# Patient Record
Sex: Female | Born: 1965 | Race: White | Hispanic: No | Marital: Married | Smoking: Current every day smoker
Health system: Southern US, Community
[De-identification: ages and names within clinical notes are randomized; demographics above are authoritative.]

## PROBLEM LIST (undated history)

## (undated) DIAGNOSIS — R42 Dizziness and giddiness: Secondary | ICD-10-CM

## (undated) DIAGNOSIS — E119 Type 2 diabetes mellitus without complications: Secondary | ICD-10-CM

## (undated) DIAGNOSIS — I10 Essential (primary) hypertension: Secondary | ICD-10-CM

---

## 2004-01-09 ENCOUNTER — Emergency Department: Payer: Self-pay | Admitting: Emergency Medicine

## 2004-05-06 ENCOUNTER — Emergency Department: Payer: Self-pay | Admitting: Emergency Medicine

## 2004-12-02 ENCOUNTER — Emergency Department: Payer: Self-pay | Admitting: Internal Medicine

## 2004-12-03 ENCOUNTER — Emergency Department: Payer: Self-pay | Admitting: Emergency Medicine

## 2005-02-27 ENCOUNTER — Ambulatory Visit: Payer: Self-pay

## 2005-10-05 ENCOUNTER — Emergency Department: Payer: Self-pay | Admitting: Emergency Medicine

## 2006-05-30 ENCOUNTER — Emergency Department: Payer: Self-pay | Admitting: Emergency Medicine

## 2007-08-01 ENCOUNTER — Emergency Department: Payer: Self-pay | Admitting: Emergency Medicine

## 2007-08-06 ENCOUNTER — Emergency Department: Payer: Self-pay | Admitting: Emergency Medicine

## 2007-08-07 ENCOUNTER — Other Ambulatory Visit: Payer: Self-pay

## 2008-04-22 ENCOUNTER — Emergency Department: Payer: Self-pay | Admitting: Emergency Medicine

## 2008-09-07 ENCOUNTER — Emergency Department: Payer: Self-pay | Admitting: Emergency Medicine

## 2008-12-21 ENCOUNTER — Emergency Department: Payer: Self-pay | Admitting: Emergency Medicine

## 2009-01-13 ENCOUNTER — Emergency Department: Payer: Self-pay | Admitting: Emergency Medicine

## 2009-04-07 ENCOUNTER — Emergency Department: Payer: Self-pay | Admitting: Emergency Medicine

## 2009-09-13 ENCOUNTER — Emergency Department (HOSPITAL_COMMUNITY): Admission: EM | Admit: 2009-09-13 | Discharge: 2009-09-13 | Payer: Self-pay | Admitting: Emergency Medicine

## 2011-10-02 ENCOUNTER — Emergency Department (HOSPITAL_COMMUNITY)
Admission: EM | Admit: 2011-10-02 | Discharge: 2011-10-02 | Disposition: A | Discharge status: Home / Self Care | Payer: Self-pay | Attending: Emergency Medicine | Admitting: Emergency Medicine

## 2011-10-02 ENCOUNTER — Encounter (HOSPITAL_COMMUNITY): Payer: Self-pay | Admitting: Adult Health

## 2011-10-02 DIAGNOSIS — F17200 Nicotine dependence, unspecified, uncomplicated: Secondary | ICD-10-CM | POA: Insufficient documentation

## 2011-10-02 DIAGNOSIS — K047 Periapical abscess without sinus: Secondary | ICD-10-CM | POA: Insufficient documentation

## 2011-10-02 MED ORDER — PENICILLIN V POTASSIUM 250 MG PO TABS
500.0000 mg | ORAL_TABLET | Freq: Once | ORAL | Status: AC
  Administered 2011-10-02: 500 mg via ORAL
  Filled 2011-10-02: qty 2

## 2011-10-02 MED ORDER — PENICILLIN V POTASSIUM 500 MG PO TABS: 500.0000 mg | ORAL_TABLET | Freq: Four times a day (QID) | ORAL | Status: AC

## 2011-10-02 NOTE — ED Provider Notes (Signed)
Medical screening examination/treatment/procedure(s) were performed by non-physician practitioner and as supervising physician I was immediately available for consultation/collaboration. Angelisse Riso, MD, FACEP   Yossi Hinchman L Tatem Fesler, MD 10/02/11 2312 

## 2011-10-02 NOTE — ED Provider Notes (Signed)
History     CSN: [id number]  Arrival date & time 10/02/11  1859   First MD Initiated Contact with Patient 10/02/11 2122      Chief Complaint  Patient presents with  . Dental Pain    (Consider location/radiation/quality/duration/timing/severity/associated sxs/prior treatment) HPI Comments: Patient has dental decay, her right upper second molar is decayed into the gumline.  There is now painful for the past 2, days, over-the-counter ibuprofen, with a moderate amount of relief.  She states she does not want any narcotic pain medicine, as she had a bad reaction in the past, but she is concerned for infection.  Patient is a 46 y.o. female presenting with tooth pain. The history is provided by the patient.  Dental PainThe primary symptoms include mouth pain. Primary symptoms do not include headaches or fever. The symptoms began yesterday. The symptoms are unchanged. The symptoms occur constantly.  Additional symptoms do not include: ear pain.    History reviewed. No pertinent past medical history.  History reviewed. No pertinent past surgical history.  History reviewed. No pertinent family history.  History  Substance Use Topics  . Smoking status: Current Everyday Smoker  . Smokeless tobacco: Not on file  . Alcohol Use: No    OB History    Grav Para Term Preterm Abortions TAB SAB Ect Mult Living                  Review of Systems  Constitutional: Negative for fever and chills.  HENT: Negative for ear pain.   Gastrointestinal: Negative for nausea.  Neurological: Negative for dizziness, weakness and headaches.    Allergies  Codeine  Home Medications   Current Outpatient Rx  Name Route Sig Dispense Refill  . IBUPROFEN 200 MG PO TABS Oral Take 800 mg by mouth every 6 (six) hours as needed. For pain      BP 125/65  Pulse 85  Temp 98.6 F (37 C)  Resp 16  SpO2 97%  Physical Exam  Constitutional: She appears well-developed and well-nourished.  HENT:  Head:  Normocephalic.  Mouth/Throat:    Neck: Normal range of motion.  Cardiovascular: Normal rate.   Musculoskeletal: Normal range of motion.  Neurological: She is alert.    ED Course  Procedures (including critical care time)  Labs Reviewed - No data to display No results found.   1. Dental abscess       MDM   Patient is refusing any narcotic pain medication.  I will start her on penicillin 5 mg 4 times a day for dental abscess.  I will refer her to Dr. Leanord Asal, DDS        Arman Filter, NP 10/02/11 2127  Arman Filter, NP 10/02/11 2127

## 2011-10-02 NOTE — ED Notes (Signed)
C/o right lower tooth pain , dental caries noted. Pain began yesterday nothing makes pain better.

## 2012-04-14 ENCOUNTER — Emergency Department: Payer: Self-pay | Admitting: Emergency Medicine

## 2019-02-05 ENCOUNTER — Other Ambulatory Visit: Payer: Self-pay

## 2019-02-05 ENCOUNTER — Encounter (HOSPITAL_COMMUNITY): Payer: Self-pay

## 2019-02-05 DIAGNOSIS — Z5321 Procedure and treatment not carried out due to patient leaving prior to being seen by health care provider: Secondary | ICD-10-CM | POA: Insufficient documentation

## 2019-02-05 NOTE — ED Triage Notes (Signed)
Pain in right knee intermittently for several days.  Pt denies injury or truama

## 2019-02-06 ENCOUNTER — Emergency Department (HOSPITAL_COMMUNITY)
Admission: EM | Admit: 2019-02-06 | Discharge: 2019-02-06 | Disposition: A | Discharge status: Home / Self Care | Payer: Self-pay | Attending: Emergency Medicine | Admitting: Emergency Medicine

## 2019-02-06 HISTORY — DX: Type 2 diabetes mellitus without complications: E11.9

## 2019-05-21 ENCOUNTER — Encounter (HOSPITAL_COMMUNITY): Payer: Self-pay

## 2019-05-21 ENCOUNTER — Emergency Department (HOSPITAL_COMMUNITY)
Admission: EM | Admit: 2019-05-21 | Discharge: 2019-05-21 | Disposition: A | Discharge status: Home / Self Care | Payer: Self-pay | Attending: Emergency Medicine | Admitting: Emergency Medicine

## 2019-05-21 ENCOUNTER — Other Ambulatory Visit: Payer: Self-pay

## 2019-05-21 DIAGNOSIS — R739 Hyperglycemia, unspecified: Secondary | ICD-10-CM

## 2019-05-21 DIAGNOSIS — E1165 Type 2 diabetes mellitus with hyperglycemia: Secondary | ICD-10-CM | POA: Insufficient documentation

## 2019-05-21 DIAGNOSIS — R42 Dizziness and giddiness: Secondary | ICD-10-CM | POA: Insufficient documentation

## 2019-05-21 DIAGNOSIS — F17210 Nicotine dependence, cigarettes, uncomplicated: Secondary | ICD-10-CM | POA: Insufficient documentation

## 2019-05-21 DIAGNOSIS — H9201 Otalgia, right ear: Secondary | ICD-10-CM | POA: Insufficient documentation

## 2019-05-21 LAB — CBG MONITORING, ED: Glucose-Capillary: 403 mg/dL — ABNORMAL HIGH (ref 70–99)

## 2019-05-21 MED ORDER — MECLIZINE HCL 25 MG PO TABS: 25.0000 mg | ORAL_TABLET | Freq: Three times a day (TID) | ORAL | 0 refills | Status: DC | PRN

## 2019-05-21 MED ORDER — MECLIZINE HCL 12.5 MG PO TABS
25.0000 mg | ORAL_TABLET | Freq: Once | ORAL | Status: AC
  Administered 2019-05-21: 25 mg via ORAL
  Filled 2019-05-21: qty 2

## 2019-05-21 MED ORDER — CEFDINIR 300 MG PO CAPS
300.0000 mg | ORAL_CAPSULE | Freq: Two times a day (BID) | ORAL | Status: DC
  Administered 2019-05-21: 300 mg via ORAL
  Filled 2019-05-21: qty 1

## 2019-05-21 MED ORDER — CEFDINIR 300 MG PO CAPS: 300.0000 mg | ORAL_CAPSULE | Freq: Two times a day (BID) | ORAL | 0 refills | Status: DC

## 2019-05-21 MED ORDER — METFORMIN HCL 500 MG PO TABS
500.0000 mg | ORAL_TABLET | Freq: Once | ORAL | Status: AC
  Administered 2019-05-21: 23:00:00 500 mg via ORAL
  Filled 2019-05-21: qty 1

## 2019-05-21 MED ORDER — AMOXICILLIN-POT CLAVULANATE 875-125 MG PO TABS
1.0000 | ORAL_TABLET | Freq: Once | ORAL | Status: DC
  Filled 2019-05-21: qty 1

## 2019-05-21 MED ORDER — AMOXICILLIN-POT CLAVULANATE 875-125 MG PO TABS: 1.0000 | ORAL_TABLET | Freq: Two times a day (BID) | ORAL | 0 refills | Status: DC

## 2019-05-21 NOTE — ED Provider Notes (Signed)
[facility] EMERGENCY DEPARTMENT Provider Note   CSN: [id number] Arrival date & time: 05/21/19  2048     History Chief Complaint  Patient presents with  . Dizziness    right ear pain    Laurie Combs is a 54 y.o. female.  Complaint dizziness (like room spinning) which feels like "vertigo" that she had 2 years ago, right ear discomfort, nausea.  She is a type II diabetic.  No gross neurological deficits, stiff neck, confusion, facial asymmetry, slurred speech, visual problem.  Meclizine has helped for this in the past.  Severity is mild to moderate.  Quick movement makes symptoms worse.        Past Medical History:  Diagnosis Date  . Diabetes mellitus without complication ([place])     There are no problems to display for this patient.   History reviewed. No pertinent surgical history.   OB History   No obstetric history on file.     No family history on file.  Social History   Tobacco Use  . Smoking status: Current Every Day Smoker    Packs/day: 0.50    Types: Cigarettes  . Smokeless tobacco: Never Used  Substance Use Topics  . Alcohol use: No  . Drug use: No    Home Medications Prior to Admission medications   Medication Sig Start Date End Date Taking? Authorizing Provider  ibuprofen (ADVIL,MOTRIN) 200 MG tablet Take 800 mg by mouth every 6 (six) hours as needed. For pain    [provider]    Allergies    Codeine  Review of Systems   Review of Systems  All other systems reviewed and are negative.   Physical Exam Updated Vital Signs BP 129/86 (BP Location: Right Arm)   Pulse 88   Temp 98.6 F (37 C) (Oral)   Resp 20   Ht 5' (1.524 m)   Wt 90.7 kg   SpO2 98%   BMI 39.06 kg/m   Physical Exam Vitals and nursing note reviewed.  Constitutional:      Appearance: She is well-developed.     Comments: No gross neuro deficits.  HENT:     Head: Normocephalic and atraumatic.     Comments: Left TM within normal limits.  Right TM  erythematous Eyes:     Conjunctiva/sclera: Conjunctivae normal.  Cardiovascular:     Rate and Rhythm: Normal rate and regular rhythm.  Pulmonary:     Effort: Pulmonary effort is normal.     Breath sounds: Normal breath sounds.  Abdominal:     General: Bowel sounds are normal.     Palpations: Abdomen is soft.  Musculoskeletal:        General: Normal range of motion.     Cervical back: Neck supple.  Skin:    General: Skin is warm and dry.  Neurological:     General: No focal deficit present.     Mental Status: She is alert and oriented to person, place, and time.  Psychiatric:        Behavior: Behavior normal.     ED Results / Procedures / Treatments   Labs (all labs ordered are listed, but only abnormal results are displayed) Labs Reviewed  CBG MONITORING, ED - Abnormal; Notable for the following components:      Result Value   Glucose-Capillary 403 (*)    All other components within normal limits    EKG None  Radiology No results found.  Procedures Procedures (including critical care time)  Medications Ordered  in ED Medications  metFORMIN (GLUCOPHAGE) tablet 500 mg (has no administration in time range)  amoxicillin-clavulanate (AUGMENTIN) 875-125 MG per tablet 1 tablet (has no administration in time range)  meclizine (ANTIVERT) tablet 25 mg (has no administration in time range)    ED Course  I have reviewed the triage vital signs and the nursing notes.  Pertinent labs & imaging results that were available during my care of the patient were reviewed by me and considered in my medical decision making (see chart for details).    MDM Rules/Calculators/A&P                      I suspect right otitis media is contributing to her sense of vertigo.  Will Rx Augmentin 875/125, meclizine 25 mg, evening dose of Metformin.  Stable for outpatient management. Final Clinical Impression(s) / ED Diagnoses Final diagnoses:  Vertigo  Right ear pain  Hyperglycemia    Rx  / DC Orders ED Discharge Orders    None       Donnetta Hutching, MD 05/21/19 2225

## 2019-05-21 NOTE — Discharge Instructions (Addendum)
Prescription for antibiotic and meclizine sent to your pharmacy.  Rest.  Avoid quick movements.

## 2019-05-21 NOTE — ED Triage Notes (Signed)
Pt reports dizziness, nausea, and right ear pain that started this evening. Pt says it "feels like vertigo", says she had it a couple of years ago.

## 2019-09-03 ENCOUNTER — Encounter: Payer: Self-pay | Admitting: Emergency Medicine

## 2019-09-03 ENCOUNTER — Ambulatory Visit
Admission: EM | Admit: 2019-09-03 | Discharge: 2019-09-03 | Disposition: A | Discharge status: Home / Self Care | Payer: Self-pay | Attending: Emergency Medicine | Admitting: Emergency Medicine

## 2019-09-03 DIAGNOSIS — J209 Acute bronchitis, unspecified: Secondary | ICD-10-CM

## 2019-09-03 DIAGNOSIS — J069 Acute upper respiratory infection, unspecified: Secondary | ICD-10-CM

## 2019-09-03 DIAGNOSIS — Z20822 Contact with and (suspected) exposure to covid-19: Secondary | ICD-10-CM

## 2019-09-03 DIAGNOSIS — R062 Wheezing: Secondary | ICD-10-CM

## 2019-09-03 MED ORDER — PREDNISONE 20 MG PO TABS: 20.0000 mg | ORAL_TABLET | Freq: Two times a day (BID) | ORAL | 0 refills | Status: AC

## 2019-09-03 MED ORDER — CETIRIZINE HCL 10 MG PO TABS: 10.0000 mg | ORAL_TABLET | Freq: Every day | ORAL | 0 refills | Status: AC

## 2019-09-03 MED ORDER — BENZONATATE 100 MG PO CAPS: 100.0000 mg | ORAL_CAPSULE | Freq: Three times a day (TID) | ORAL | 0 refills | Status: DC

## 2019-09-03 MED ORDER — FLUTICASONE PROPIONATE 50 MCG/ACT NA SUSP: 2.0000 | Freq: Every day | NASAL | 0 refills | Status: DC

## 2019-09-03 NOTE — Discharge Instructions (Signed)
COVID testing ordered.  It will take between 2-5 days for test results.  Someone will contact you regarding abnormal results.    In the meantime: You should remain isolated in your home for 10 days from symptom onset AND greater than 72 hours after symptoms resolution (absence of fever without the use of fever-reducing medication and improvement in respiratory symptoms), whichever is longer Get plenty of rest and push fluids Tessalon Perles prescribed for cough zyrtec for nasal congestion, runny nose, and/or sore throat flonase for nasal congestion and runny nose Use medications daily for symptom relief Use OTC medications like ibuprofen or tylenol as needed fever or pain Call or go to the ED if you have any new or worsening symptoms such as fever, worsening cough, shortness of breath, chest tightness, chest pain, turning blue, changes in mental status, etc...  

## 2019-09-03 NOTE — ED Provider Notes (Signed)
[facility] CARE CENTER   [id number] 09/03/19 Arrival Time: 1644   CC: URI  SUBJECTIVE: History from: patient.  Laurie Combs is a 54 y.o. female who presents with nasal congestion, runny nose, sore throat and cough with yellow sputum x 2 days.  Reports sick exposure to RSV, and strep. Also admits to tobacco use.  Denies alleviating or aggravating factors.  Reports previous symptoms in the past.   Complains of subjective fever.  Denies  SOB, chest pain, nausea, changes in bowel or bladder habits.    ROS: As per HPI.  All other pertinent ROS negative.     Past Medical History:  Diagnosis Date  . Diabetes mellitus without complication (HCC)    History reviewed. No pertinent surgical history. Allergies  Allergen Reactions  . Penicillins Shortness Of Breath  . Codeine Other (See Comments)    Light-headedness   No current facility-administered medications on file prior to encounter.   No current outpatient medications on file prior to encounter.   Social History   Socioeconomic History  . Marital status: Single    Spouse name: Not on file  . Number of children: Not on file  . Years of education: Not on file  . Highest education level: Not on file  Occupational History  . Not on file  Tobacco Use  . Smoking status: Current Every Day Smoker    Packs/day: 0.50    Types: Cigarettes  . Smokeless tobacco: Never Used  Vaping Use  . Vaping Use: Never used  Substance and Sexual Activity  . Alcohol use: No  . Drug use: No  . Sexual activity: Not on file  Other Topics Concern  . Not on file  Social History Narrative  . Not on file   Social Determinants of Health   Financial Resource Strain:   . Difficulty of Paying Living Expenses:   Food Insecurity:   . Worried About Programme researcher, broadcasting/film/video in the Last Year:   . Barista in the Last Year:   Transportation Needs:   . Freight forwarder (Medical):   Marland Kitchen Lack of Transportation (Non-Medical):   Physical Activity:     . Days of Exercise per Week:   . Minutes of Exercise per Session:   Stress:   . Feeling of Stress :   Social Connections:   . Frequency of Communication with Friends and Family:   . Frequency of Social Gatherings with Friends and Family:   . Attends Religious Services:   . Active Member of Clubs or Organizations:   . Attends Banker Meetings:   Marland Kitchen Marital Status:   Intimate Partner Violence:   . Fear of Current or Ex-Partner:   . Emotionally Abused:   Marland Kitchen Physically Abused:   . Sexually Abused:    No family history on file.  OBJECTIVE:  Vitals:   09/03/19 1658 09/03/19 1704  BP: (!) 138/91   Pulse: (!) 105   Resp: 17   Temp: 98.1 F (36.7 C)   TempSrc: Oral   SpO2: 94%   Weight:  198 lb 6.6 oz (90 kg)  Height:  5' (1.524 m)     General appearance: alert; appears mildly fatigued, but nontoxic; speaking in full sentences and tolerating own secretions HEENT: NCAT; Ears: EACs clear, TMs pearly gray; Eyes: PERRL.  EOM grossly intact. Nose: nares patent without rhinorrhea, Throat: oropharynx clear, tonsils mildly erythematous not enlarged, uvula midline  Neck: supple without LAD Lungs: unlabored respirations, symmetrical air entry;  cough: mild; no respiratory distress; diffuse wheezes throughout bilateral lung fields Heart: regular rate and rhythm.   Skin: warm and dry Psychological: alert and cooperative; normal mood and affect   ASSESSMENT & PLAN:  1. Viral URI with cough   2. Encounter for laboratory testing for COVID-19 virus   3. Wheezing   4. Acute bronchitis, unspecified organism     Meds ordered this encounter  Medications  . cetirizine (ZYRTEC) 10 MG tablet    Sig: Take 1 tablet (10 mg total) by mouth daily.    Dispense:  30 tablet    Refill:  0    Order Specific Question:   Supervising Provider    Answer:   Eustace Moore [[id number]]  . fluticasone (FLONASE) 50 MCG/ACT nasal spray    Sig: Place 2 sprays into both nostrils daily.     Dispense:  16 g    Refill:  0    Order Specific Question:   Supervising Provider    Answer:   Eustace Moore [[id number]]  . benzonatate (TESSALON) 100 MG capsule    Sig: Take 1 capsule (100 mg total) by mouth every 8 (eight) hours.    Dispense:  21 capsule    Refill:  0    Order Specific Question:   Supervising Provider    Answer:   Eustace Moore [[id number]]  . predniSONE (DELTASONE) 20 MG tablet    Sig: Take 1 tablet (20 mg total) by mouth 2 (two) times daily with a meal for 5 days.    Dispense:  10 tablet    Refill:  0    Order Specific Question:   Supervising Provider    Answer:   Eustace Moore [[id number]]    COVID testing ordered.  It will take between 2-5 days for test results.  Someone will contact you regarding abnormal results.    In the meantime: You should remain isolated in your home for 10 days from symptom onset AND greater than 72 hours after symptoms resolution (absence of fever without the use of fever-reducing medication and improvement in respiratory symptoms), whichever is longer Get plenty of rest and push fluids Tessalon Perles prescribed for cough zyrtec for nasal congestion, runny nose, and/or sore throat flonase for nasal congestion and runny nose Use medications daily for symptom relief Use OTC medications like ibuprofen or tylenol as needed fever or pain Call or go to the ED if you have any new or worsening symptoms such as fever, worsening cough, shortness of breath, chest tightness, chest pain, turning blue, changes in mental status, etc...   Reviewed expectations re: course of current medical issues. Questions answered. Outlined signs and symptoms indicating need for more acute intervention. Patient verbalized understanding. After Visit Summary given.         Rennis Harding, PA-C 09/03/19 1715

## 2019-09-03 NOTE — ED Triage Notes (Signed)
Cough and congestion x 2 days, states she ran a fever but did not take her temp.

## 2019-09-04 LAB — SARS-COV-2, NAA 2 DAY TAT

## 2019-09-04 LAB — NOVEL CORONAVIRUS, NAA: SARS-CoV-2, NAA: NOT DETECTED

## 2020-08-12 ENCOUNTER — Other Ambulatory Visit: Payer: Self-pay

## 2020-08-12 DIAGNOSIS — Z5321 Procedure and treatment not carried out due to patient leaving prior to being seen by health care provider: Secondary | ICD-10-CM | POA: Insufficient documentation

## 2020-08-12 DIAGNOSIS — M25472 Effusion, left ankle: Secondary | ICD-10-CM | POA: Insufficient documentation

## 2020-08-12 DIAGNOSIS — M25471 Effusion, right ankle: Secondary | ICD-10-CM | POA: Insufficient documentation

## 2020-08-13 ENCOUNTER — Emergency Department (HOSPITAL_COMMUNITY)
Admission: EM | Admit: 2020-08-13 | Discharge: 2020-08-13 | Disposition: A | Discharge status: Home / Self Care | Payer: Self-pay | Attending: Emergency Medicine | Admitting: Emergency Medicine

## 2020-08-13 ENCOUNTER — Other Ambulatory Visit: Payer: Self-pay

## 2020-08-13 ENCOUNTER — Encounter (HOSPITAL_COMMUNITY): Payer: Self-pay

## 2020-08-13 NOTE — ED Triage Notes (Signed)
Pt presents to ED from home for c/o swelling to bilateral ankles/feet, reports worse on left ankle. Pt has minimal swelling to left medial ankle. Pulses WNL. No pitting edema noted.

## 2020-09-16 ENCOUNTER — Encounter (HOSPITAL_COMMUNITY): Payer: Self-pay | Admitting: *Deleted

## 2020-09-16 ENCOUNTER — Other Ambulatory Visit: Payer: Self-pay

## 2020-09-16 ENCOUNTER — Emergency Department (HOSPITAL_COMMUNITY)
Admission: EM | Admit: 2020-09-16 | Discharge: 2020-09-16 | Disposition: A | Discharge status: Home / Self Care | Payer: Self-pay | Attending: Emergency Medicine | Admitting: Emergency Medicine

## 2020-09-16 ENCOUNTER — Emergency Department (HOSPITAL_COMMUNITY): Payer: Self-pay

## 2020-09-16 DIAGNOSIS — E119 Type 2 diabetes mellitus without complications: Secondary | ICD-10-CM | POA: Insufficient documentation

## 2020-09-16 DIAGNOSIS — F17210 Nicotine dependence, cigarettes, uncomplicated: Secondary | ICD-10-CM | POA: Insufficient documentation

## 2020-09-16 DIAGNOSIS — U071 COVID-19: Secondary | ICD-10-CM | POA: Insufficient documentation

## 2020-09-16 DIAGNOSIS — R000 Tachycardia, unspecified: Secondary | ICD-10-CM | POA: Insufficient documentation

## 2020-09-16 DIAGNOSIS — Z28310 Unvaccinated for covid-19: Secondary | ICD-10-CM | POA: Insufficient documentation

## 2020-09-16 HISTORY — DX: Dizziness and giddiness: R42

## 2020-09-16 LAB — RESP PANEL BY RT-PCR (FLU A&B, COVID) ARPGX2
Influenza A by PCR: NEGATIVE
Influenza B by PCR: NEGATIVE
SARS Coronavirus 2 by RT PCR: POSITIVE — AB

## 2020-09-16 LAB — BASIC METABOLIC PANEL
Anion gap: 8 (ref 5–15)
BUN: 13 mg/dL (ref 6–20)
CO2: 21 mmol/L — ABNORMAL LOW (ref 22–32)
Calcium: 8.7 mg/dL — ABNORMAL LOW (ref 8.9–10.3)
Chloride: 104 mmol/L (ref 98–111)
Creatinine, Ser: 0.58 mg/dL (ref 0.44–1.00)
GFR, Estimated: >60 mL/min (ref >60)
Glucose, Bld: 174 mg/dL — ABNORMAL HIGH (ref 70–99)
Potassium: 3.8 mmol/L (ref 3.5–5.1)
Sodium: 133 mmol/L — ABNORMAL LOW (ref 135–145)

## 2020-09-16 LAB — CBC WITH DIFFERENTIAL/PLATELET
Abs Immature Granulocytes: 0.15 10*3/uL — ABNORMAL HIGH (ref 0.00–0.07)
Basophils Absolute: 0.1 10*3/uL (ref 0.0–0.1)
Basophils Relative: 1 %
Eosinophils Absolute: 0.1 10*3/uL (ref 0.0–0.5)
Eosinophils Relative: 1 %
HCT: 39.7 % (ref 36.0–46.0)
Hemoglobin: 13.5 g/dL (ref 12.0–15.0)
Immature Granulocytes: 2 %
Lymphocytes Relative: 9 %
Lymphs Abs: 0.6 10*3/uL — ABNORMAL LOW (ref 0.7–4.0)
MCH: 30.3 pg (ref 26.0–34.0)
MCHC: 34 g/dL (ref 30.0–36.0)
MCV: 89.2 fL (ref 80.0–100.0)
Monocytes Absolute: 1 10*3/uL (ref 0.1–1.0)
Monocytes Relative: 15 %
Neutro Abs: 5.1 10*3/uL (ref 1.7–7.7)
Neutrophils Relative %: 72 %
Platelets: 199 10*3/uL (ref 150–400)
RBC: 4.45 MIL/uL (ref 3.87–5.11)
RDW: 12.3 % (ref 11.5–15.5)
WBC: 7.1 10*3/uL (ref 4.0–10.5)
nRBC: 0 % (ref 0.0–0.2)

## 2020-09-16 MED ORDER — ACETAMINOPHEN 325 MG PO TABS
650.0000 mg | ORAL_TABLET | Freq: Once | ORAL | Status: AC
  Administered 2020-09-16: 650 mg via ORAL
  Filled 2020-09-16: qty 2

## 2020-09-16 MED ORDER — MOLNUPIRAVIR EUA 200MG CAPSULE: 4.0000 | ORAL_CAPSULE | Freq: Two times a day (BID) | ORAL | 0 refills | Status: DC

## 2020-09-16 MED ORDER — MOLNUPIRAVIR EUA 200MG CAPSULE: 4.0000 | ORAL_CAPSULE | Freq: Two times a day (BID) | ORAL | 0 refills | Status: AC

## 2020-09-16 NOTE — ED Notes (Addendum)
Date and time results received: 09/16/20 2221 (use smartphrase ".now" to insert current time)  Test: Covid Critical Value: Positive  Name of Provider Notified: Uvaldo Rising, [place]  Orders Received? Or Actions Taken?:

## 2020-09-16 NOTE — Discharge Instructions (Addendum)
You have COVID-19, starting on an antiviral please take as prescribed.  I recommend taking Tylenol for fever control and ibuprofen for pain control please follow dosing on the back of bottle.  I recommend staying hydrated and if you do not an appetite, I recommend soups as this will provide you with fluids and calories.    you are Covid positive you must self quarantine for 5 days starting on symptom onset, if at the end of those 5 days you are feeling better you may return back to school/work, if you continue to have symptoms you must self quarantine for additional 5 days.  I would like you to contact "post Covid care" as they will provide you with information how to manage your Covid symptoms if you are Covid positive.    Come back to the emergency department if you develop chest pain, shortness of breath, severe abdominal pain, uncontrolled nausea, vomiting, diarrhea.

## 2020-09-16 NOTE — ED Provider Notes (Signed)
[facility] EMERGENCY DEPARTMENT Provider Note   CSN: [id number] Arrival date & time: 09/16/20  2037     History Chief Complaint  Patient presents with   Dizziness    Laurie Combs is a 55 y.o. female.  HPI  Patient with significant medical history of diabetes, vertigo presents to the emergent for chief complaint of URI-like symptoms.  Patient states this started today, she endorses headaches, fevers, chills, nasal congestion, productive cough, general body aches, she also notes that she felt slightly dizzy today but this comes and goes, she currently does not feel dizziness right now.  She denies chest pain, shortness of breath, states she is tolerating p.o. without any difficulty.  She is not vaccinated COVID-19, denies recent sick contacts.  She has no other complaints at this time.  She denies any alleviating or aggravating factors.  Past Medical History:  Diagnosis Date   Diabetes mellitus without complication (HCC)    Vertigo     There are no problems to display for this patient.   No past surgical history on file.   OB History   No obstetric history on file.     No family history on file.  Social History   Tobacco Use   Smoking status: Every Day    Packs/day: 0.50    Types: Cigarettes   Smokeless tobacco: Never  Vaping Use   Vaping Use: Never used  Substance Use Topics   Alcohol use: No   Drug use: No    Home Medications Prior to Admission medications   Medication Sig Start Date End Date Taking? Authorizing Provider  molnupiravir EUA 200 mg CAPS Take 4 capsules (800 mg total) by mouth 2 (two) times daily for 5 days. 09/16/20 09/21/20 Yes Carroll Sage, PA-C  benzonatate (TESSALON) 100 MG capsule Take 1 capsule (100 mg total) by mouth every 8 (eight) hours. 09/03/19   Wurst, [place], PA-C  cetirizine (ZYRTEC) 10 MG tablet Take 1 tablet (10 mg total) by mouth daily. 09/03/19   Wurst, [place], PA-C  fluticasone (FLONASE) 50 MCG/ACT nasal spray Place 2  sprays into both nostrils daily. 09/03/19   Wurst, [place], PA-C    Allergies    Penicillins and Codeine  Review of Systems   Review of Systems  Constitutional:  Positive for chills and fever.  HENT:  Positive for congestion. Negative for sore throat.   Respiratory:  Negative for cough and shortness of breath.   Cardiovascular:  Negative for chest pain.  Gastrointestinal:  Negative for abdominal pain, diarrhea, nausea and vomiting.  Genitourinary:  Negative for enuresis.  Musculoskeletal:  Positive for myalgias. Negative for back pain.  Skin:  Negative for rash.  Neurological:  Positive for dizziness and headaches.  Hematological:  Does not bruise/bleed easily.   Physical Exam Updated Vital Signs BP 101/64   Pulse 95   Temp 99.5 F (37.5 C) (Oral)   Resp 17   Ht 5' (1.524 m)   Wt 90.7 kg   SpO2 96%   BMI 39.06 kg/m   Physical Exam Vitals and nursing note reviewed.  Constitutional:      General: She is not in acute distress.    Appearance: She is not ill-appearing.  HENT:     Head: Normocephalic and atraumatic.     Nose: Congestion present.     Mouth/Throat:     Mouth: Mucous membranes are moist.     Pharynx: Oropharynx is clear. No oropharyngeal exudate or posterior oropharyngeal erythema.  Eyes:  Conjunctiva/sclera: Conjunctivae normal.  Cardiovascular:     Rate and Rhythm: Normal rate and regular rhythm.     Pulses: Normal pulses.     Heart sounds: No murmur heard.   No friction rub. No gallop.  Pulmonary:     Effort: No respiratory distress.     Breath sounds: No wheezing, rhonchi or rales.  Abdominal:     Palpations: Abdomen is soft.     Tenderness: There is no abdominal tenderness.  Musculoskeletal:     Right lower leg: No edema.     Left lower leg: No edema.     Comments: Patient has full range of motion 5 of 5 strength neurovascular tact in upper and lower extremities.  Skin:    General: Skin is warm and dry.  Neurological:     Mental Status:  She is alert.     Comments: No facial asymmetry, no difficulty with word finding, no word slurring, able to follow commands, no unilateral weakness, steady gait.  Psychiatric:        Mood and Affect: Mood normal.    ED Results / Procedures / Treatments   Labs (all labs ordered are listed, but only abnormal results are displayed) Labs Reviewed  RESP PANEL BY RT-PCR (FLU A&B, COVID) ARPGX2 - Abnormal; Notable for the following components:      Result Value   SARS Coronavirus 2 by RT PCR POSITIVE (*)    All other components within normal limits  BASIC METABOLIC PANEL - Abnormal; Notable for the following components:   Sodium 133 (*)    CO2 21 (*)    Glucose, Bld 174 (*)    Calcium 8.7 (*)    All other components within normal limits  CBC WITH DIFFERENTIAL/PLATELET - Abnormal; Notable for the following components:   Lymphs Abs 0.6 (*)    Abs Immature Granulocytes 0.15 (*)    All other components within normal limits    EKG EKG Interpretation  Date/Time:  Saturday September 16 2020 20:55:17 EDT Ventricular Rate:  104 PR Interval:  145 QRS Duration: 105 QT Interval:  346 QTC Calculation: 456 R Axis:   27 Text Interpretation: Sinus tachycardia Low voltage, precordial leads RSR' in V1 or V2, right VCD or RVH Confirmed by Bethann Berkshire [phone]) on 09/16/2020 10:43:26 PM  Radiology DG Chest Port 1 View  Result Date: 09/16/2020 CLINICAL DATA:  Cough, fever and vertigo EXAM: PORTABLE CHEST 1 VIEW COMPARISON:  Radiograph 08/07/2007 FINDINGS: Accounting for chest habitus and some mild atelectatic changes, the lungs are clear. No focal consolidation or convincing features of edema. Pulmonary vascularity is normally distributed. No pneumothorax or effusion. Cardiomediastinal contours are unremarkable for portable technique. No acute or worrisome abnormality of the chest wall. Degenerative changes are present in the imaged spine and shoulders. Dextrocurvature of the spine is similar to comparison  prior. Telemetry leads overlie the chest. IMPRESSION: No acute cardiopulmonary abnormality. Electronically Signed   By: Kreg Shropshire M.D.   On: 09/16/2020 21:49    Procedures Procedures   Medications Ordered in ED Medications  acetaminophen (TYLENOL) tablet 650 mg (650 mg Oral Given 09/16/20 2128)    ED Course  I have reviewed the triage vital signs and the nursing notes.  Pertinent labs & imaging results that were available during my care of the patient were reviewed by me and considered in my medical decision making (see chart for details).    MDM Rules/Calculators/A&P  Initial impression-patient presents with URI-like symptoms.  She is alert, does not appear in acute stress, vital signs noted for tachycardia.  Suspect viral URI, will obtain basic lab work-up, respiratory panel, chest x-ray provide use Tylenol and reassess.  Work-up-CBC unremarkable, BMP shows hyponatremia 133, CO2 21, Leukos 174, COVID-positive, chest x-ray negative.  EKG sinus tach without signs of ischemia.  Reassessment-patient was reassessed after Tylenol, she has no complaints this time, vital signs improved, patient is agreeable for discharge.  Rule out-I have low suspicion for CVA as there is no focal deficits present on exam, she did endorse some dizziness but this is intermittent and has since resolved  low suspicion for cerebellar stroke low suspicion for systemic infection as patient is nontoxic-appearing, vital signs reassuring, no obvious source infection noted on exam.  Low suspicion for pneumonia as lung sounds are clear bilaterally, x-ray did not reveal any acute findings.  I have low suspicion for PE as patient denies pleuritic chest pain, shortness of breath, no hypoxia nontachypneic. low suspicion for strep throat as oropharynx was visualized, no erythema or exudates noted.  Low suspicion patient would need  hospitalized due to viral infection or Covid as vital signs reassuring,  patient is not in respiratory distress.    Plan-  URI-suspect patient's symptoms are from COVID, unfortunately due to drug interaction will defer Paxlovid  and use a different antiviral as she is at increased risk for adverse reaction as she is nonvaccinated.  We will have her follow-up with post-COVID care for further evaluation.  Vital signs have remained stable, no indication for hospital admission..  Patient given at home care as well strict return precautions.  Patient verbalized that they understood agreed to said plan.  Final Clinical Impression(s) / ED Diagnoses Final diagnoses:  COVID    Rx / DC Orders ED Discharge Orders          Ordered    molnupiravir EUA 200 mg CAPS  2 times daily        09/16/20 2247             Carroll Sage, PA-C 09/16/20 2249    Bethann Berkshire, MD 09/18/20 1049

## 2020-09-16 NOTE — ED Triage Notes (Signed)
Pt with hx of vertigo and states started yesterday.  HA started today at 1200.

## 2020-09-16 NOTE — ED Notes (Signed)
Pt had mentioned her husband starting coughing yesterday.  EDP made aware of pt's symptoms and her fever.

## 2021-02-27 ENCOUNTER — Other Ambulatory Visit: Payer: Self-pay

## 2021-02-27 ENCOUNTER — Ambulatory Visit
Admission: EM | Admit: 2021-02-27 | Discharge: 2021-02-27 | Disposition: A | Discharge status: Home / Self Care | Payer: Self-pay

## 2021-02-27 ENCOUNTER — Encounter: Payer: Self-pay | Admitting: Emergency Medicine

## 2021-02-27 DIAGNOSIS — E119 Type 2 diabetes mellitus without complications: Secondary | ICD-10-CM

## 2021-02-27 DIAGNOSIS — F17200 Nicotine dependence, unspecified, uncomplicated: Secondary | ICD-10-CM

## 2021-02-27 DIAGNOSIS — Z794 Long term (current) use of insulin: Secondary | ICD-10-CM

## 2021-02-27 DIAGNOSIS — R062 Wheezing: Secondary | ICD-10-CM

## 2021-02-27 DIAGNOSIS — R052 Subacute cough: Secondary | ICD-10-CM

## 2021-02-27 DIAGNOSIS — B349 Viral infection, unspecified: Secondary | ICD-10-CM

## 2021-02-27 HISTORY — DX: Essential (primary) hypertension: I10

## 2021-02-27 MED ORDER — PREDNISONE 20 MG PO TABS: ORAL_TABLET | ORAL | 0 refills | Status: AC

## 2021-02-27 MED ORDER — ALBUTEROL SULFATE HFA 108 (90 BASE) MCG/ACT IN AERS
1.0000 | INHALATION_SPRAY | Freq: Four times a day (QID) | RESPIRATORY_TRACT | 0 refills | Status: AC | PRN
Start: 2021-02-27 — End: ?

## 2021-02-27 NOTE — Discharge Instructions (Addendum)
We will notify you of your test results as they arrive and may take between 48-72 hours.  I encourage you to sign up for MyChart if you have not already done so as this can be the easiest way for [place] to communicate results to you online or through a phone app.  Generally, we only contact you if it is a positive test result.  In the meantime, if you develop worsening symptoms including fever, chest pain, shortness of breath despite our current treatment plan then please report to the emergency room as this may be a sign of worsening status from possible viral infection.  Otherwise, we will manage this as a viral syndrome. For sore throat or cough try using a honey-based tea. Use 3 teaspoons of honey with juice squeezed from half lemon. Place shaved pieces of ginger into 1/2-1 cup of water and warm over stove top. Then mix the ingredients and repeat every 4 hours as needed. Please take Tylenol 500mg -650mg  every 6 hours for aches and pains, fevers. Hydrate very well with at least 2 liters of water. Eat light meals such as soups to replenish electrolytes and soft fruits, veggies. Start an antihistamine like Zyrtec for postnasal drainage, sinus congestion.  You can take this together with prednisone. Use cough suppression medications as needed.     For diabetes or elevated blood sugar, please make sure you are limiting and avoiding starchy, carbohydrate foods like pasta, breads, sweet breads, pastry, rice, potatoes, desserts. These foods can elevate your blood sugar. Also, limit and avoid drinks that contain a lot of sugar such as sodas, sweet teas, fruit juices.  Drinking plain water will be much more helpful, try 64 ounces of water daily.  It is okay to flavor your water naturally by cutting cucumber, lemon, mint or lime, placing it in a picture with water and drinking it over a period of 24-48 hours as long as it remains refrigerated.  For elevated blood pressure, make sure you are monitoring salt in your diet.   Do not eat restaurant foods and limit processed foods at home. I highly recommend you prepare and cook your own foods at home.  Processed foods include things like frozen meals, pre-seasoned meats and dinners, deli meats, canned foods as these foods contain a high amount of sodium/salt.  Make sure you are paying attention to sodium labels on foods you buy at the grocery store. Buy your spices separately such as garlic powder, onion powder, cumin, cayenne, parsley flakes so that you can avoid seasonings that contain salt. However, salt-free seasonings are available and can be used, an example is Mrs. Dash and includes a lot of different mixtures that do not contain salt.  Lastly, when cooking using oils that are healthier for you is important. This includes olive oil, avocado oil, canola oil. We have discussed a lot of foods to avoid but below is a list of foods that can be very healthy to use in your diet whether it is for diabetes, cholesterol, high blood pressure, or in general healthy eating.  Salads - kale, spinach, cabbage, spring mix, arugula Fruits - avocadoes, berries (blueberries, raspberries, blackberries), apples, oranges, pomegranate, grapefruit, kiwi Vegetables - asparagus, cauliflower, broccoli, green beans, brussel sprouts, bell peppers, beets; stay away from or limit starchy vegetables like potatoes, carrots, peas Other general foods - kidney beans, egg whites, almonds, walnuts, sunflower seeds, pumpkin seeds, fat free yogurt, almond milk, flax seeds, quinoa, oats  Meat - It is better to eat lean meats  and limit your red meat including pork to once a week.  Wild caught fish, chicken breast are good options as they tend to be leaner sources of good protein. Still be mindful of the sodium labels for the meats you buy.  DO NOT EAT ANY FOODS ON THIS LIST THAT YOU ARE ALLERGIC TO. For more specific needs, I highly recommend consulting a dietician or nutritionist but this can definitely be a  good starting point.

## 2021-02-27 NOTE — ED Triage Notes (Signed)
Cough x several days with wheezing and runny nose.

## 2021-02-27 NOTE — ED Provider Notes (Signed)
[facility]   MRN: [id number] DOB: 08-Dec-1965  Subjective:   Laurie Combs is a 56 y.o. female presenting for several day history of coughing, wheezing and a runny stuffy nose.  Patient is a smoker, does 1ppd.  Would like to have steroids as she has done well with this in the past.  She has diabetes and is well controlled, does not take insulin.  She is not opposed to COVID or flu testing.  No chest pain, shortness of breath.  No history of asthma, COPD.  She has never used an inhaler.  No current facility-administered medications for this encounter.  Current Outpatient Medications:    gabapentin (NEURONTIN) 100 MG capsule, Take 100 mg by mouth as needed., Disp: , Rfl:    glipiZIDE (GLUCOTROL) 5 MG tablet, Take by mouth daily before breakfast., Disp: , Rfl:    lovastatin (MEVACOR) 10 MG tablet, Take 10 mg by mouth at bedtime., Disp: , Rfl:    metFORMIN (GLUCOPHAGE) 500 MG tablet, Take by mouth 2 (two) times daily with a meal., Disp: , Rfl:    benzonatate (TESSALON) 100 MG capsule, Take 1 capsule (100 mg total) by mouth every 8 (eight) hours., Disp: 21 capsule, Rfl: 0   cetirizine (ZYRTEC) 10 MG tablet, Take 1 tablet (10 mg total) by mouth daily., Disp: 30 tablet, Rfl: 0   fluticasone (FLONASE) 50 MCG/ACT nasal spray, Place 2 sprays into both nostrils daily., Disp: 16 g, Rfl: 0   Allergies  Allergen Reactions   Penicillins Shortness Of Breath   Codeine Other (See Comments)    Light-headedness    Past Medical History:  Diagnosis Date   Diabetes mellitus without complication (HCC)    Hypertension    Vertigo      History reviewed. No pertinent surgical history.  History reviewed. No pertinent family history.  Social History   Tobacco Use   Smoking status: Every Day    Packs/day: 0.50    Types: Cigarettes   Smokeless tobacco: Never  Vaping Use   Vaping Use: Never used  Substance Use Topics   Alcohol use: No   Drug use: No    ROS   Objective:    Vitals: BP (!) 163/95 (BP Location: Right Arm)    Pulse 76    Temp 97.7 F (36.5 C) (Oral)    Resp 18    SpO2 91%   Pulse oximetry ranged from 91 to 93% and hovered around 93% during my time with her.  Physical Exam Constitutional:      General: She is not in acute distress.    Appearance: Normal appearance. She is well-developed. She is not ill-appearing, toxic-appearing or diaphoretic.  HENT:     Head: Normocephalic and atraumatic.     Nose: Nose normal.     Mouth/Throat:     Mouth: Mucous membranes are moist.  Eyes:     Extraocular Movements: Extraocular movements intact.     Pupils: Pupils are equal, round, and reactive to light.  Cardiovascular:     Rate and Rhythm: Normal rate and regular rhythm.     Pulses: Normal pulses.     Heart sounds: Normal heart sounds. No murmur heard.   No friction rub. No gallop.  Pulmonary:     Effort: Pulmonary effort is normal. No respiratory distress.     Breath sounds: No stridor. Wheezing (diffuse throughout) present. No rhonchi or rales.  Skin:    General: Skin is warm and dry.     Findings: No  rash.  Neurological:     Mental Status: She is alert and oriented to person, place, and time.  Psychiatric:        Mood and Affect: Mood normal.        Behavior: Behavior normal.        Thought Content: Thought content normal.    Assessment and Plan :   PDMP not reviewed this encounter.  1. Acute viral syndrome   2. Wheezing   3. Subacute cough   4. Type 2 diabetes mellitus treated with insulin (HCC)   5. Smoker    In the context of her smoking, recommended an oral prednisone course as she has done well with this in the past.  Deferred imaging as she has diffuse wheezing and no signs of rales, rhonchi.  COVID and flu testing pending.  Use supportive care otherwise. Counseled patient on potential for adverse effects with medications prescribed/recommended today, ER and return-to-clinic precautions discussed, patient verbalized  understanding.    Wallis Bamberg, [place] 02/27/21 1334

## 2021-02-28 LAB — COVID-19, FLU A+B NAA
Influenza A, NAA: NOT DETECTED
Influenza B, NAA: NOT DETECTED
SARS-CoV-2, NAA: NOT DETECTED

## 2022-09-06 IMAGING — DX DG CHEST 1V PORT
1 series · 1 of 1 positions shown · non-contrast
Comparison: Radiograph 08/07/2007

CLINICAL DATA: Cough, fever and vertigo

EXAM:
PORTABLE CHEST 1 VIEW

[chest ap]
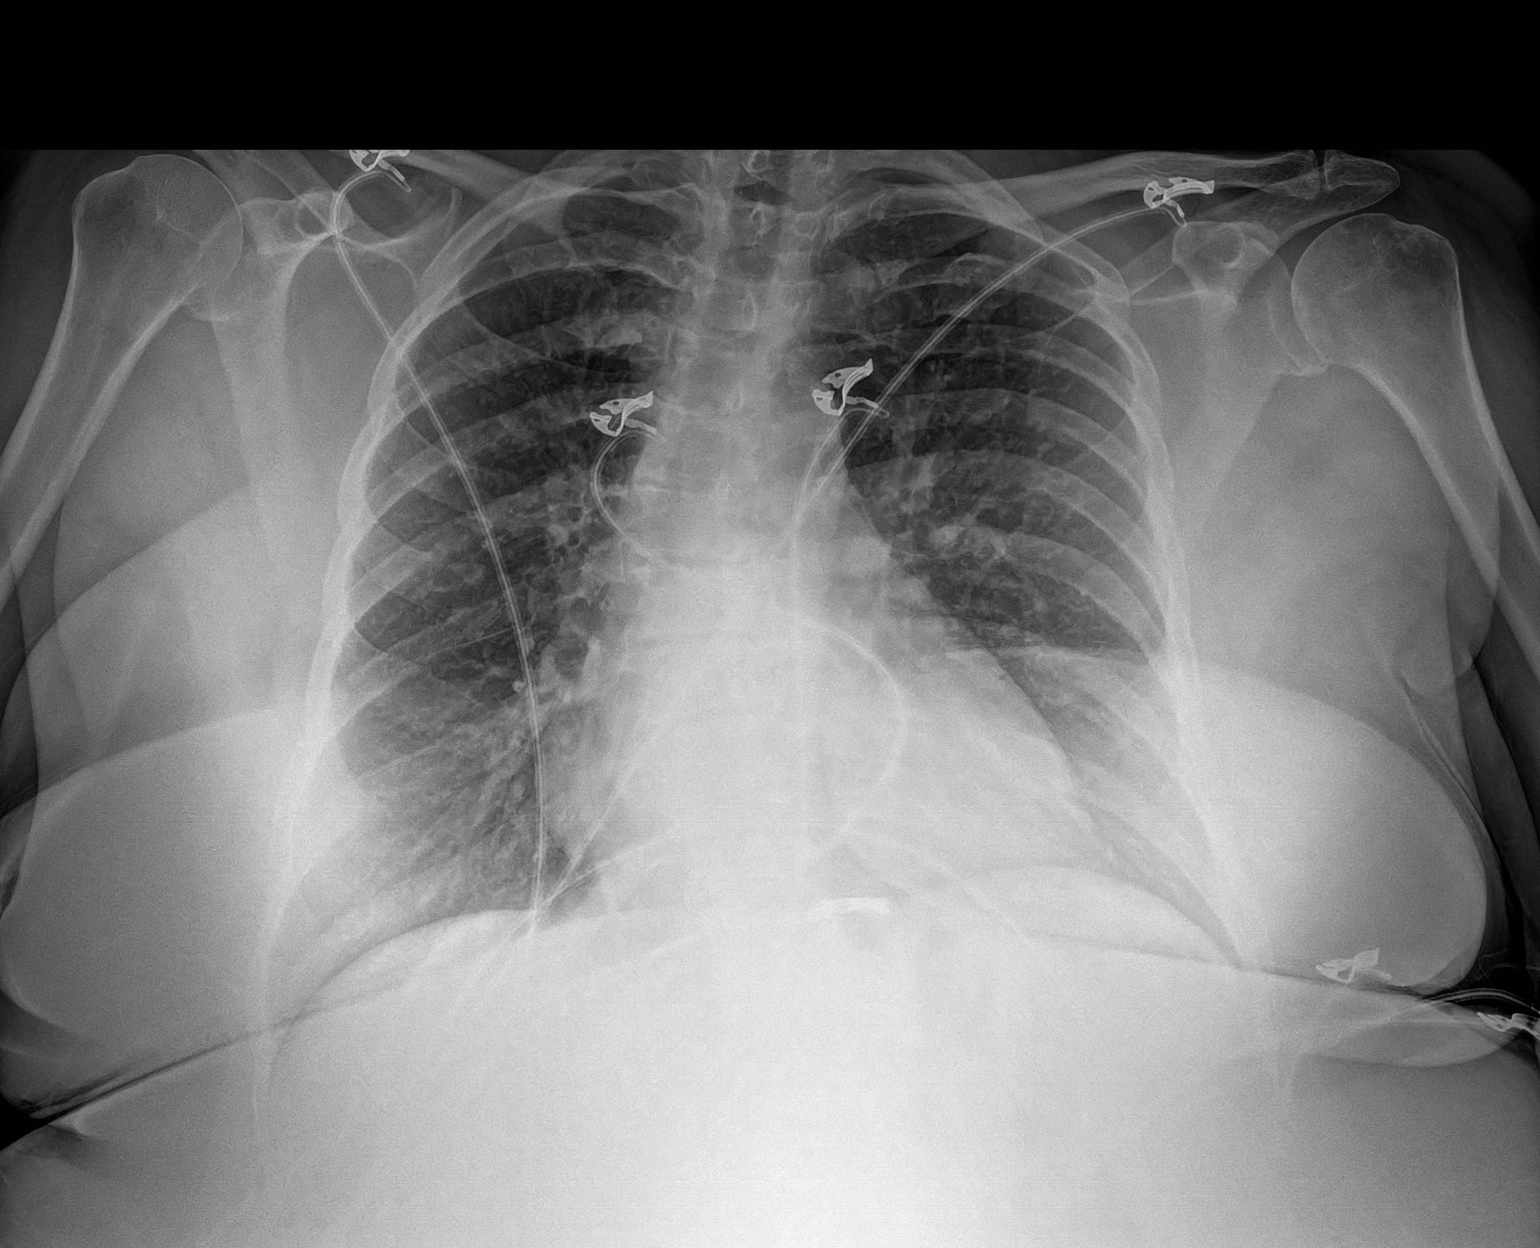

[1 of 1 positions shown; findings below may reference images not displayed]

FINDINGS: Accounting for chest habitus and some mild atelectatic changes, the
lungs are clear. No focal consolidation or convincing features of
edema. Pulmonary vascularity is normally distributed. No
pneumothorax or effusion. Cardiomediastinal contours are
unremarkable for portable technique. No acute or worrisome
abnormality of the chest wall. Degenerative changes are present in
the imaged spine and shoulders. Dextrocurvature of the spine is
similar to comparison prior. Telemetry leads overlie the chest.
IMPRESSION: No acute cardiopulmonary abnormality.
